# Patient Record
Sex: Female | Born: 1995 | Hispanic: Yes | Marital: Single | Smoking: Never smoker
Health system: Southern US, Community
[De-identification: ages and names within clinical notes are randomized; demographics above are authoritative.]

---

## 2018-12-06 ENCOUNTER — Emergency Department (HOSPITAL_COMMUNITY): Payer: No Typology Code available for payment source

## 2018-12-06 ENCOUNTER — Other Ambulatory Visit: Payer: Self-pay

## 2018-12-06 ENCOUNTER — Emergency Department (HOSPITAL_COMMUNITY)
Admission: EM | Admit: 2018-12-06 | Discharge: 2018-12-06 | Disposition: A | Discharge status: Home / Self Care | Payer: No Typology Code available for payment source | Attending: Emergency Medicine | Admitting: Emergency Medicine

## 2018-12-06 ENCOUNTER — Encounter (HOSPITAL_COMMUNITY): Payer: Self-pay | Admitting: *Deleted

## 2018-12-06 DIAGNOSIS — Y92410 Unspecified street and highway as the place of occurrence of the external cause: Secondary | ICD-10-CM | POA: Insufficient documentation

## 2018-12-06 DIAGNOSIS — Y93I9 Activity, other involving external motion: Secondary | ICD-10-CM | POA: Insufficient documentation

## 2018-12-06 DIAGNOSIS — R55 Syncope and collapse: Secondary | ICD-10-CM

## 2018-12-06 DIAGNOSIS — Y999 Unspecified external cause status: Secondary | ICD-10-CM | POA: Diagnosis not present

## 2018-12-06 LAB — CBG MONITORING, ED: Glucose-Capillary: 75 mg/dL (ref 70–99)

## 2018-12-06 LAB — I-STAT BETA HCG BLOOD, ED (MC, WL, AP ONLY): I-stat hCG, quantitative: <5 m[IU]/mL (ref <5)

## 2018-12-06 NOTE — Discharge Instructions (Addendum)
You were evaluated in the Emergency Department and after careful evaluation, we did not find any emergent condition requiring admission or further testing in the hospital.  Your exam/testing today was overall reassuring.  No injuries found on your pictures today.  Please use Tylenol or Motrin at home for discomfort.  Please return to the Emergency Department if you experience any worsening of your condition.  We encourage you to follow up with a primary care provider.  Thank you for allowing [place] to be a part of your care.

## 2018-12-06 NOTE — ED Notes (Signed)
RN called CT to get pt
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
## 2018-12-06 NOTE — ED Notes (Signed)
Pt ambulated to restroom without distress.  

## 2018-12-06 NOTE — ED Notes (Signed)
Patient transported to CT 
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
